# Patient Record
Sex: Male | Born: 1990 | Race: White | Hispanic: No | Marital: Single | Smoking: Current every day smoker
Health system: Southern US, Community
[De-identification: ages and names within clinical notes are randomized; demographics above are authoritative.]

---

## 2014-03-29 ENCOUNTER — Emergency Department (INDEPENDENT_AMBULATORY_CARE_PROVIDER_SITE_OTHER): Payer: PRIVATE HEALTH INSURANCE

## 2014-03-29 ENCOUNTER — Emergency Department (HOSPITAL_COMMUNITY)
Admission: EM | Admit: 2014-03-29 | Discharge: 2014-03-29 | Disposition: A | Discharge status: Home / Self Care | Payer: PRIVATE HEALTH INSURANCE | Source: Home / Self Care | Attending: Family Medicine | Admitting: Family Medicine

## 2014-03-29 ENCOUNTER — Encounter (HOSPITAL_COMMUNITY): Payer: Self-pay | Admitting: Emergency Medicine

## 2014-03-29 DIAGNOSIS — K625 Hemorrhage of anus and rectum: Secondary | ICD-10-CM

## 2014-03-29 LAB — POCT URINALYSIS DIP (DEVICE)
Bilirubin Urine: NEGATIVE
Glucose, UA: NEGATIVE mg/dL
Hgb urine dipstick: NEGATIVE
KETONES UR: NEGATIVE mg/dL
Leukocytes, UA: NEGATIVE
Nitrite: NEGATIVE
PH: 6 (ref 5.0–8.0)
PROTEIN: NEGATIVE mg/dL
Urobilinogen, UA: 0.2 mg/dL (ref 0.0–1.0)

## 2014-03-29 LAB — COMPREHENSIVE METABOLIC PANEL
ALBUMIN: 4.3 g/dL (ref 3.5–5.2)
ALT: 35 U/L (ref 0–53)
AST: 24 U/L (ref 0–37)
Alkaline Phosphatase: 68 U/L (ref 39–117)
BUN: 11 mg/dL (ref 6–23)
CALCIUM: 9.4 mg/dL (ref 8.4–10.5)
CHLORIDE: 102 meq/L (ref 96–112)
CO2: 20 mEq/L (ref 19–32)
CREATININE: 0.84 mg/dL (ref 0.50–1.35)
GFR calc Af Amer: >90 mL/min (ref >90)
GFR calc non Af Amer: >90 mL/min (ref >90)
Glucose, Bld: 111 mg/dL — ABNORMAL HIGH (ref 70–99)
Potassium: 3.8 mEq/L (ref 3.7–5.3)
Sodium: 138 mEq/L (ref 137–147)
Total Bilirubin: 0.3 mg/dL (ref 0.3–1.2)
Total Protein: 7.6 g/dL (ref 6.0–8.3)

## 2014-03-29 LAB — CBC
HCT: 40.9 % (ref 39.0–52.0)
HEMOGLOBIN: 14.8 g/dL (ref 13.0–17.0)
MCH: 29.8 pg (ref 26.0–34.0)
MCHC: 36.2 g/dL — ABNORMAL HIGH (ref 30.0–36.0)
MCV: 82.5 fL (ref 78.0–100.0)
Platelets: 271 10*3/uL (ref 150–400)
RBC: 4.96 MIL/uL (ref 4.22–5.81)
RDW: 13.2 % (ref 11.5–15.5)
WBC: 8.8 10*3/uL (ref 4.0–10.5)

## 2014-03-29 LAB — LIPASE, BLOOD: Lipase: 58 U/L (ref 11–59)

## 2014-03-29 MED ORDER — GI COCKTAIL ~~LOC~~
30.0000 mL | Freq: Once | ORAL | Status: AC
  Administered 2014-03-29: 30 mL via ORAL

## 2014-03-29 MED ORDER — SUCRALFATE 1 G PO TABS: 1.0000 g | ORAL_TABLET | Freq: Three times a day (TID) | ORAL | Status: AC

## 2014-03-29 MED ORDER — GI COCKTAIL ~~LOC~~
ORAL | Status: AC
  Filled 2014-03-29: qty 30

## 2014-03-29 MED ORDER — OMEPRAZOLE 40 MG PO CPDR: 40.0000 mg | DELAYED_RELEASE_CAPSULE | Freq: Every day | ORAL | Status: AC

## 2014-03-29 NOTE — ED Provider Notes (Signed)
Xavier Anthony is a 23 y.o. male who presents to Urgent Care today for rectal bleeding. Patient has had a month of bright red blood per rectum. The past 2 days he's noted dark red blood. He denies any heart or painful stools. He denies any rectal itching or pain. He notes a mild upper left quadrant discomfort. He has not tried any medications yet. He additionally notes one or 2 months of occasional blood in nasal sputum or sputum that has been coughed up. He denies any nausea vomiting or diarrhea. He is taking ibuprofen recently stopped taking it when the bleeding worsened. Additionally he notes a mild 10 pound weight loss over the past month but he attributes this to increasing his activity recently. He recently started working [employer] [place] He denies any personal or family history of inflammatory bowel disease.   History reviewed. No pertinent past medical history. History  Substance Use Topics  . Smoking status: Current Every Day Smoker -- 1.00 packs/day    Types: Cigarettes  . Smokeless tobacco: Not on file  . Alcohol Use: No   ROS as above Medications: No current facility-administered medications for this encounter.   Current Outpatient Prescriptions  Medication Sig Dispense Refill  . omeprazole (PRILOSEC) 40 MG capsule Take 1 capsule (40 mg total) by mouth daily.  30 capsule  1  . sucralfate (CARAFATE) 1 G tablet Take 1 tablet (1 g total) by mouth 4 (four) times daily -  with meals and at bedtime.  60 tablet  0    Exam:  BP 132/92  Pulse 81  Temp(Src) 98.7 F (37.1 C) (Oral)  Resp 18  SpO2 98% Gen: Well NAD HEENT: EOMI,  MMM Lungs: Normal work of breathing. CTABL Heart: RRR no MRG Abd: NABS, Soft. NT, ND Exts: Brisk capillary refill, warm and well perfused.  Rectal exam: Normal appearing anus. No masses palpated on digital rectal exam. No stool to Hemoccult. Anoscope shows no internal hemorrhoids or fissures.  Patient was given a GI cocktail and felt  very mildly improved.    Results for orders placed during the hospital encounter of 03/29/14 (from the past 24 hour(s))  POCT URINALYSIS DIP (DEVICE)     Status: None   Collection Time    03/29/14 12:41 PM      Result Value Ref Range   Glucose, UA NEGATIVE  NEGATIVE mg/dL   Bilirubin Urine NEGATIVE  NEGATIVE   Ketones, ur NEGATIVE  NEGATIVE mg/dL   Specific Gravity, Urine >=1.030  1.005 - 1.030   Hgb urine dipstick NEGATIVE  NEGATIVE   pH 6.0  5.0 - 8.0   Protein, ur NEGATIVE  NEGATIVE mg/dL   Urobilinogen, UA 0.2  0.0 - 1.0 mg/dL   Nitrite NEGATIVE  NEGATIVE   Leukocytes, UA NEGATIVE  NEGATIVE  COMPREHENSIVE METABOLIC PANEL     Status: Abnormal   Collection Time    03/29/14  1:02 PM      Result Value Ref Range   Sodium 138  137 - 147 mEq/L   Potassium 3.8  3.7 - 5.3 mEq/L   Chloride 102  96 - 112 mEq/L   CO2 20  19 - 32 mEq/L   Glucose, Bld 111 (*) 70 - 99 mg/dL   BUN 11  6 - 23 mg/dL   Creatinine, Ser 1.610.84  0.50 - 1.35 mg/dL   Calcium 9.4  8.4 - 09.610.5 mg/dL   Total Protein 7.6  6.0 - 8.3 g/dL   Albumin 4.3  3.5 - 5.2 g/dL   AST 24  0 - 37 U/L   ALT 35  0 - 53 U/L   Alkaline Phosphatase 68  39 - 117 U/L   Total Bilirubin 0.3  0.3 - 1.2 mg/dL   GFR calc non Af Amer >90  >90 mL/min   GFR calc Af Amer >90  >90 mL/min  CBC     Status: Abnormal   Collection Time    03/29/14  1:02 PM      Result Value Ref Range   WBC 8.8  4.0 - 10.5 K/uL   RBC 4.96  4.22 - 5.81 MIL/uL   Hemoglobin 14.8  13.0 - 17.0 g/dL   HCT 40.940.9  81.139.0 - 91.452.0 %   MCV 82.5  78.0 - 100.0 fL   MCH 29.8  26.0 - 34.0 pg   MCHC 36.2 (*) 30.0 - 36.0 g/dL   RDW 78.213.2  95.611.5 - 21.315.5 %   Platelets 271  150 - 400 K/uL  LIPASE, BLOOD     Status: None   Collection Time    03/29/14  1:02 PM      Result Value Ref Range   Lipase 58  11 - 59 U/L   Dg Abd Acute W/chest  03/29/2014   CLINICAL DATA:  Rectal bleeding for 2 days with bloody sputum.  EXAM: ACUTE ABDOMEN SERIES (ABDOMEN 2 VIEW & CHEST 1 VIEW)  COMPARISON:   None.  FINDINGS: There is no evidence of dilated bowel loops or free intraperitoneal air. No radiopaque calculi or other significant radiographic abnormality is seen. Heart size and mediastinal contours are within normal limits. Both lungs are clear.  IMPRESSION: Negative abdominal radiographs.  No acute cardiopulmonary disease.   Electronically Signed   By: Davonna BellingJohn  Curnes M.D.   On: 03/29/2014 13:09    Assessment and Plan: 23 y.o. male with painless occasional rectal bleeding and blood-tinged sputum.Patient had mild improvement with GI cocktail.   The differential at this time includes gastritis, gastric or duodenal ulcer, diverticulosis, internal hemorrhoid or fissure, or inflammatory bowel disease. He appears to be generally well and has no dramatically abnormal exam findings or laboratory findings. Plan to treat with omeprazole and Carafate. Refer to get urology for further workup and evaluation. Recommend abstaining from NSAIDs or aspirin.  Discussed warning signs or symptoms. Please see discharge instructions. Patient expresses understanding.    Rodolph BongEvan S Corey, MD 03/29/14 [phone]

## 2014-03-29 NOTE — ED Notes (Signed)
C/o blood in stool a month ago with wiping.   Two days dark/bright red blood.   Normal bowel movements.   Tightness in abdomen.   10 pound weight loss in the past week.    Blood in sputum.    Lower back pain. Not sure its from working.   N/v  Yesterday.

## 2014-03-29 NOTE — Discharge Instructions (Signed)
Thank you for coming in today. Take omeprazole daily and Carafate 4 times daily for at least one week. Avoid aspirin, ibuprofen, or aleve/  Followup with [facility].  If your belly pain worsens, or you have high fever, bad vomiting, blood in your stool or black tarry stool go to the Emergency Room.   Bloody Stools Bloody stools often mean that there is a problem in the digestive tract. Your caregiver may use the term "melena" to describe black, tarry, and bad smelling stools or "hematochezia" to describe red or maroon-colored stools. Blood seen in the stool can be caused by bleeding anywhere along the intestinal tract.  A black stool usually means that blood is coming from the upper part of the gastrointestinal tract (esophagus, stomach, or small bowel). Passing maroon-colored stools or bright red blood usually means that blood is coming from lower down in the large bowel or the rectum. However, sometimes massive bleeding in the stomach or small intestine can cause bright red bloody stools.  Consuming black licorice, lead, iron pills, medicines containing bismuth subsalicylate, or blueberries can also cause black stools. Your caregiver can test black stools to see if blood is present. It is important that the cause of the bleeding be found. Treatment can then be started, and the problem can be corrected. Rectal bleeding may not be serious, but you should not assume everything is okay until you know the cause.It is very important to follow up with your caregiver or a specialist in gastrointestinal problems. CAUSES  Blood in the stools can come from various underlying causes.Often, the cause is not found during your first visit. Testing is often needed to discover the cause of bleeding in the gastrointestinal tract. Causes range from simple to serious or even life-threatening.Possible causes include:  Hemorrhoids.These are veins that are full of blood (engorged) in the rectum. They  cause pain, inflammation, and may bleed.  Anal fissures.These are areas of painful tearing which may bleed. They are often caused by passing hard stool.  Diverticulosis.These are pouches that form on the colon over time, with age, and may bleed significantly.  Diverticulitis.This is inflammation in areas with diverticulosis. It can cause pain, fever, and bloody stools, although bleeding is rare.  Proctitis and colitis. These are inflamed areas of the rectum or colon. They may cause pain, fever, and bloody stools.  Polyps and cancer. Colon cancer is a leading cause of preventable cancer death.It often starts out as precancerous polyps that can be removed during a colonoscopy, preventing progression into cancer. Sometimes, polyps and cancer may cause rectal bleeding.  Gastritis and ulcers.Bleeding from the upper gastrointestinal tract (near the stomach) may travel through the intestines and produce black, sometimes tarry, often bad smelling stools. In certain cases, if the bleeding is fast enough, the stools may not be black, but red and the condition may be life-threatening. SYMPTOMS  You may have stools that are bright red and bloody, that are normal color with blood on them, or that are dark black and tarry. In some cases, you may only have blood in the toilet bowl. Any of these cases need medical care. You may also have:  Pain at the anus or anywhere in the rectum.  Lightheadedness or feeling faint.  Extreme weakness.  Nausea or vomiting.  Fever. DIAGNOSIS Your caregiver may use the following methods to find the cause of your bleeding:  Taking a medical history. Age is important. Older people tend to develop polyps and cancer more often. If there is  anal pain and a hard, large stool associated with bleeding, a tear of the anus may be the cause. If blood drips into the toilet after a bowel movement, bleeding hemorrhoids may be the problem. The color and frequency of the bleeding are  additional considerations. In most cases, the medical history provides clues, but seldom the final answer.  A visual and finger (digital) exam. Your caregiver will inspect the anal area, looking for tears and hemorrhoids. A finger exam can provide information when there is tenderness or a growth inside. In men, the prostate is also examined.  Endoscopy. Several types of small, long scopes (endoscopes) are used to view the colon.  In the office, your caregiver may use a rigid, or more commonly, a flexible viewing sigmoidoscope. This exam is called flexible sigmoidoscopy. It is performed in 5 to 10 minutes.  A more thorough exam is accomplished with a colonoscope. It allows your caregiver to view the entire 5 to 6 foot long colon. Medicine to help you relax (sedative) is usually given for this exam. Frequently, a bleeding lesion may be present beyond the reach of the sigmoidoscope. So, a colonoscopy may be the best exam to start with. Both exams are usually done on an outpatient basis. This means the patient does not stay overnight in the hospital or surgery center.  An upper endoscopy may be needed to examine your stomach. Sedation is used and a flexible endoscope is put in your mouth, down to your stomach.  A barium enema X-ray. This is an X-ray exam. It uses liquid barium inserted by enema into the rectum. This test alone may not identify an actual bleeding point. X-rays highlight abnormal shadows, such as those made by lumps (tumors), diverticuli, or colitis. TREATMENT  Treatment depends on the cause of your bleeding.   For bleeding from the stomach or colon, the caregiver doing your endoscopy or colonoscopy may be able to stop the bleeding as part of the procedure.  Inflammation or infection of the colon can be treated with medicines.  Many rectal problems can be treated with creams, suppositories, or warm baths.  Surgery is sometimes needed.  Blood transfusions are sometimes needed if you  have lost a lot of blood.  For any bleeding problem, let your caregiver know if you take aspirin or other blood thinners regularly. HOME CARE INSTRUCTIONS   Take any medicines exactly as prescribed.  Keep your stools soft by eating a diet high in fiber. Prunes (1 to 3 a day) work well for many people.  Drink enough water and fluids to keep your urine clear or pale yellow.  Take sitz baths if advised. A sitz bath is when you sit in a bathtub with warm water for 10 to 15 minutes to soak, soothe, and cleanse the rectal area.  If enemas or suppositories are advised, be sure you know how to use them. Tell your caregiver if you have problems with this.  Monitor your bowel movements to look for signs of improvement or worsening. SEEK MEDICAL CARE IF:   You do not improve in the time expected.  Your condition worsens after initial improvement.  You develop any new symptoms. SEEK IMMEDIATE MEDICAL CARE IF:   You develop severe or prolonged rectal bleeding.  You vomit blood.  You feel weak or faint.  You have a fever. MAKE SURE YOU:  Understand these instructions.  Will watch your condition.  Will get help right away if you are not doing well or get worse.  Document Released: 11/18/2002 Document Revised: 02/20/2012 Document Reviewed: 04/15/2011 [facility] Patient Information 2014 [employer] [place].

## 2014-05-01 ENCOUNTER — Encounter: Payer: Self-pay | Admitting: Family Medicine

## 2015-05-25 IMAGING — CR DG ABDOMEN ACUTE W/ 1V CHEST
3 series · 3 of 3 positions shown · non-contrast
Comparison: None.

CLINICAL DATA: Rectal bleeding for 2 days with bloody sputum.

EXAM:
ACUTE ABDOMEN SERIES (ABDOMEN 2 VIEW & CHEST 1 VIEW)

[view not recorded (1 of 3)]
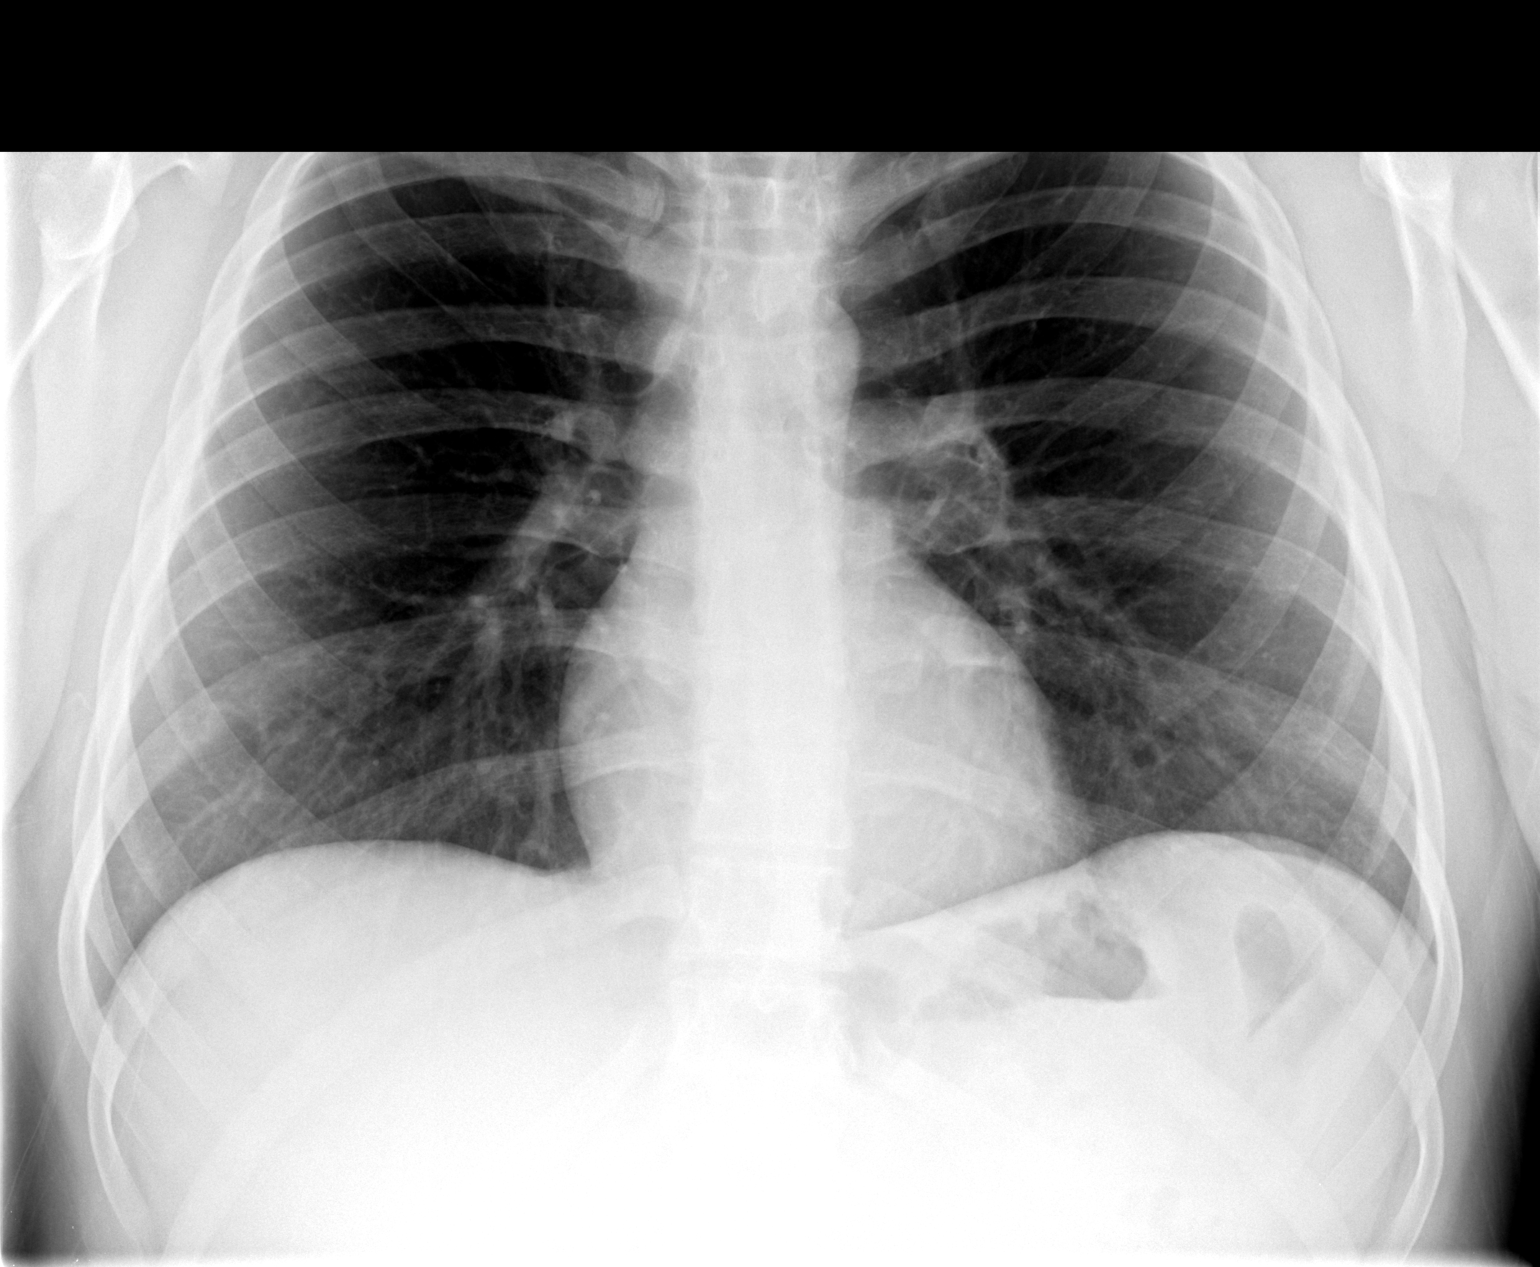

[view not recorded (2 of 3)]
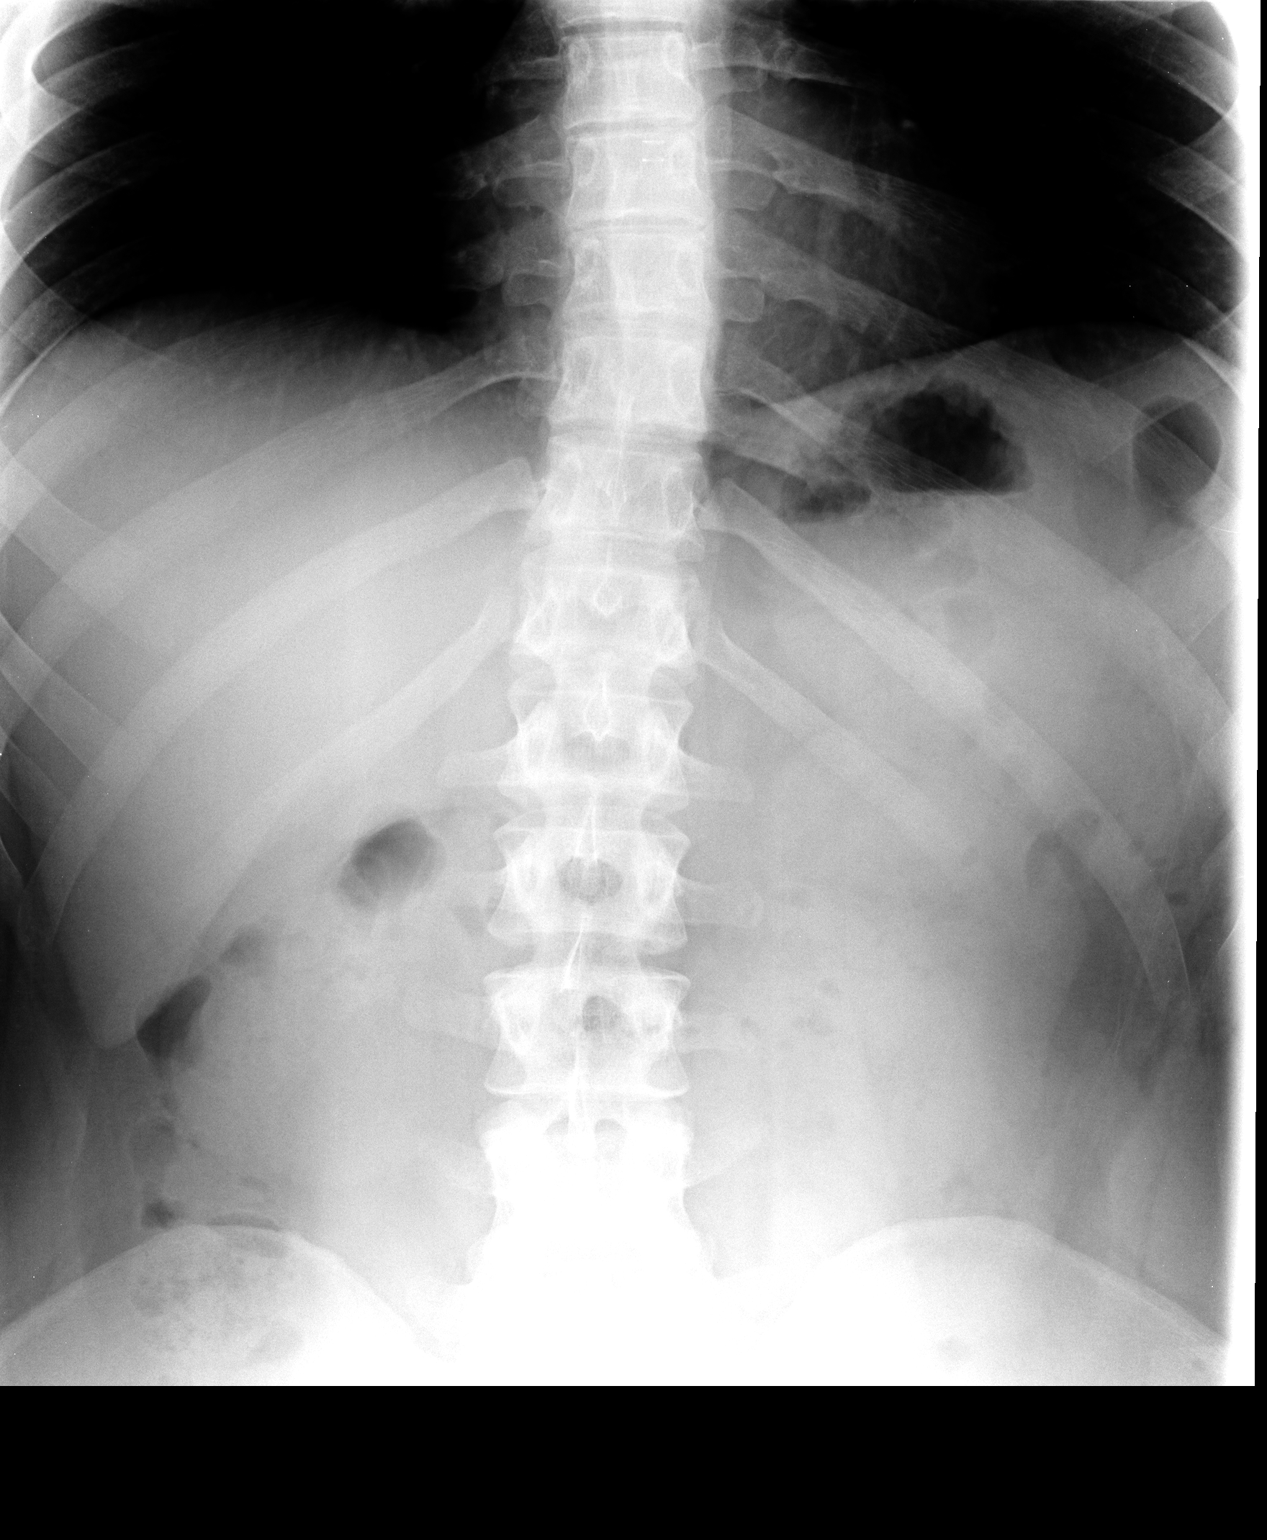

[view not recorded (3 of 3)]
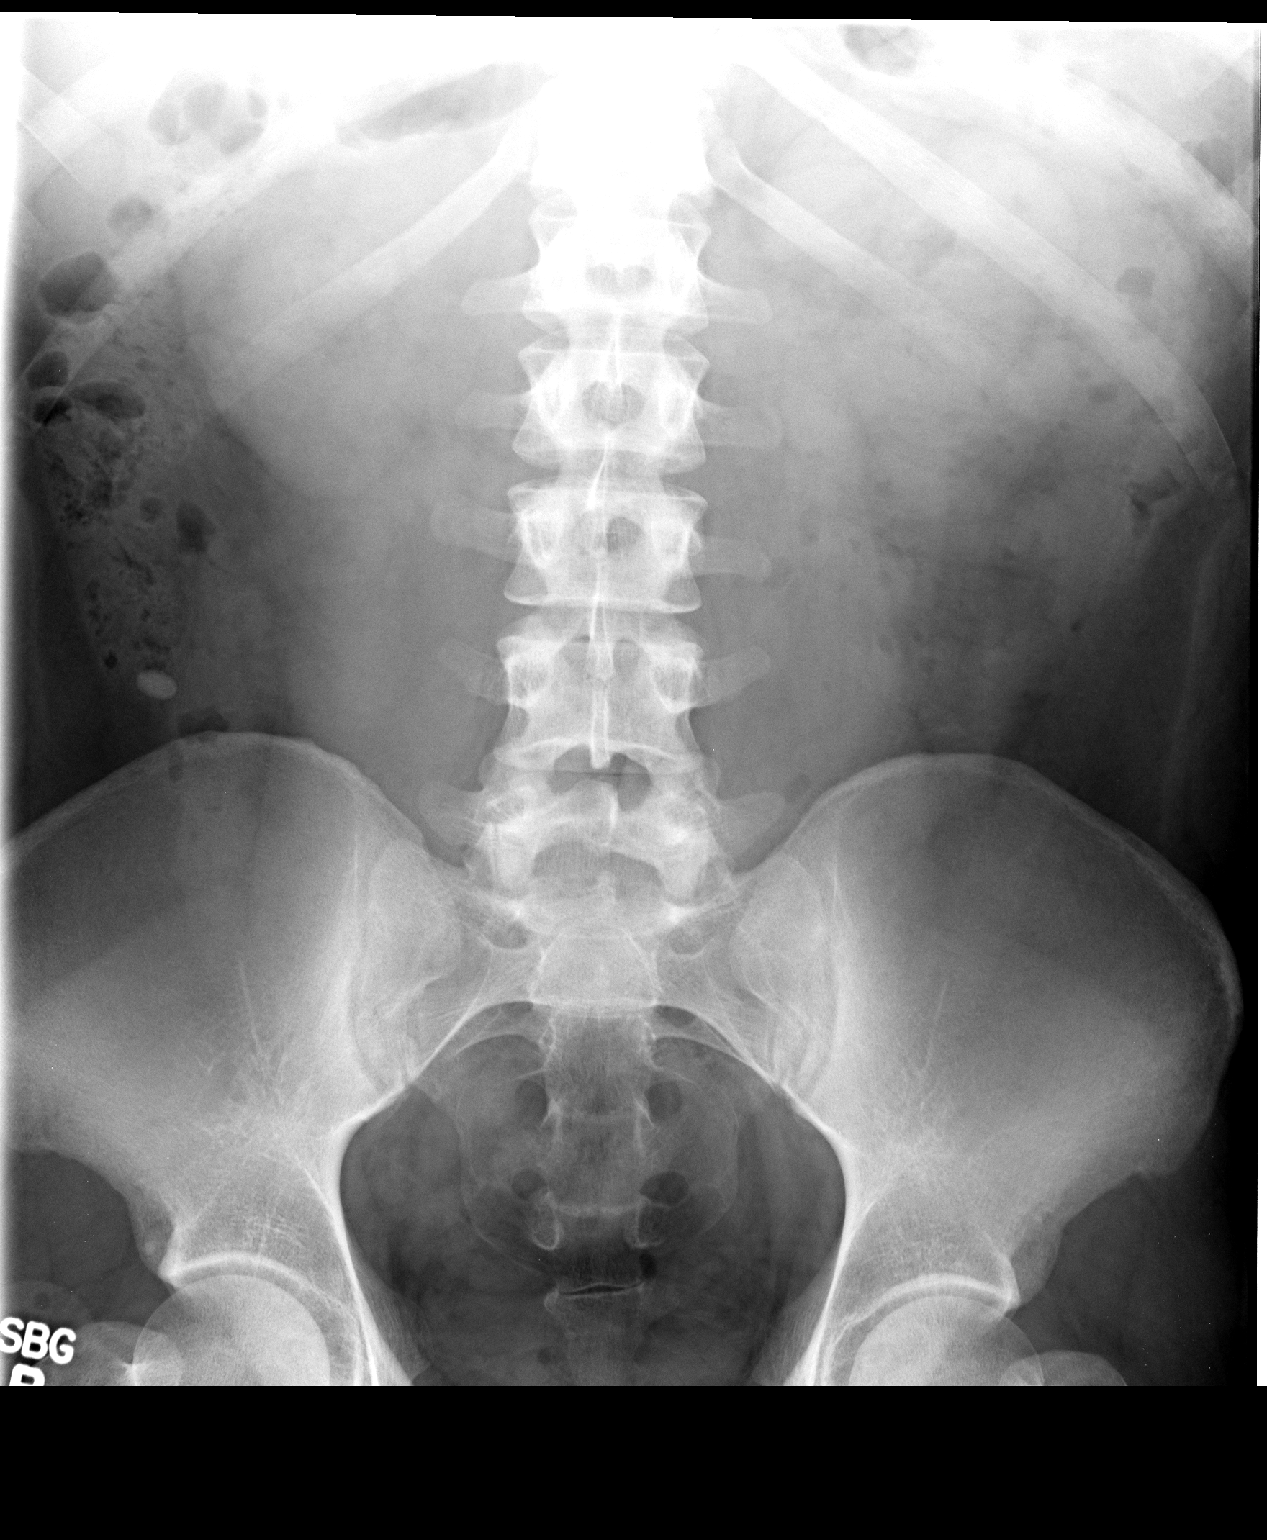

[3 of 3 positions shown; findings below may reference images not displayed]

FINDINGS: There is no evidence of dilated bowel loops or free intraperitoneal
air. No radiopaque calculi or other significant radiographic
abnormality is seen. Heart size and mediastinal contours are within
normal limits. Both lungs are clear.
IMPRESSION: Negative abdominal radiographs.  No acute cardiopulmonary disease.
